# Patient Record
Sex: Female | Born: 2000 | Hispanic: No | Marital: Single | Smoking: Never smoker
Health system: Southern US, Community
[De-identification: ages and names within clinical notes are randomized; demographics above are authoritative.]

---
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
## 2019-09-10 ENCOUNTER — Ambulatory Visit: Payer: Self-pay | Attending: Internal Medicine

## 2019-09-10 DIAGNOSIS — Z23 Encounter for immunization: Secondary | ICD-10-CM

## 2019-09-10 NOTE — Progress Notes (Signed)
   Covid-19 Vaccination Clinic  Name:  Elizabeth Whitaker    MRN: [id number] DOB: 12-01-2000  09/10/2019  [name] was observed post Covid-19 immunization for 15 minutes without incident. She was provided with Vaccine Information Sheet and instruction to access the V-Safe system.   [name] was instructed to call 911 with any severe reactions post vaccine: Marland Kitchen Difficulty breathing  . Swelling of face and throat  . A fast heartbeat  . A bad rash all over body  . Dizziness and weakness   Immunizations Administered    Name Date Dose VIS Date Route   Pfizer COVID-19 Vaccine 09/10/2019 11:44 AM 0.3 mL 06/05/2018 Intramuscular   Manufacturer: [employer], [employer]   Lot: [id number]   NDC: [address]-9

## 2022-03-07 ENCOUNTER — Ambulatory Visit: Payer: BC Managed Care – PPO | Attending: Cardiovascular Disease | Admitting: Cardiovascular Disease

## 2022-03-07 ENCOUNTER — Encounter: Payer: Self-pay | Admitting: Cardiovascular Disease

## 2022-03-07 VITALS — BP 104/80 | HR 69 | Ht 64.0 in | Wt 118.4 lb

## 2022-03-07 DIAGNOSIS — R079 Chest pain, unspecified: Secondary | ICD-10-CM | POA: Diagnosis not present

## 2022-03-07 DIAGNOSIS — R0602 Shortness of breath: Secondary | ICD-10-CM

## 2022-03-07 NOTE — Progress Notes (Unsigned)
Cardiology Office Note:    Date:  03/08/2022   ID:  Elizabeth Whitaker, DOB 2000/06/04, MRN [id number]  PCP:  Patient, No Pcp Per   [facility] Providers Cardiologist:  None     Referring MD: No ref. provider found   Chief Complaint  Patient presents with   Chest Pain   Shortness of Breath    History of Present Illness:    Elizabeth Whitaker is a 21 y.o. female with a hx of previous excellent health and no chronic health conditions, has had approximately 1 month of chest discomfort, fatigue and dyspnea.  Symptoms began relatively abruptly on a Monday afternoon 1 month ago.  Although the symptoms have improved they still persist.  She has 2 types of chest discomfort.  The dominant discomfort is a pressure-like sensation in her anterior chest.  This is somewhat positional, but is not associated with physical activity.  She also has some sharp shooting unpredictable episodes of discomfort that occur in various locations throughout her chest.  She denies palpitations, dizziness, syncope, lower extremity edema, focal neurological complaints or claudication.  She has occasional headaches.  She was seen in urgent care and subsequently in the emergency room at [facility] on 02/16/2022.  Her chest x-ray was normal with a narrow mediastinum and no evidence of cardiomegaly or lung infiltrates.  D-dimer was low.  Her electrocardiogram was normal.  Routine labs are also normal,, including a hemoglobin of 12.7 and WBC 9.7.  She reports several episodes of joint subluxation but she has never had a joint dislocation.  Due to her joint hypermobility one of her providers has raised the possibility that she might have a variant of Ehlers-Danlos syndrome.  She received both COVID-19 and flu shots this fall, but they were administered after the onset of her symptoms.  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  History reviewed. No pertinent past medical history.  History reviewed. No pertinent surgical history.  Current Medications: Current Meds  Medication Sig   levonorgestrel-ethinyl estradiol (ALESSE) 0.1-20 MG-MCG tablet Take 1 tablet by mouth daily.     Allergies:   Patient has no allergy information on record.   Social History   Socioeconomic History   Marital status: Single    Spouse name: Not on file   Number of children: Not on file   Years of education: Not on file   Highest education level: Not on file  Occupational History   Not on file  Tobacco Use   Smoking status: Never    Passive exposure: Never   Smokeless tobacco: Never  Substance and Sexual Activity   Alcohol use: Not on file   Drug use: Not on file   Sexual activity: Not on file  Other Topics Concern   Not on file  Social History Narrative   Not on file   Social Determinants of Health   Financial Resource Strain: Not on file  Food Insecurity: Not on file  Transportation Needs: Not on file  Physical Activity: Not on file  Stress: Not on file  Social Connections: Not on file     Family History: The patient early onset CAD or other heart or vascular problems  ROS:   Please see the history of present illness.     All other systems reviewed and are negative.  EKGs/Labs/Other Studies Reviewed:    The following studies were reviewed today: Notes, imaging studies, labs, ECG report  from [facility]  EKG:  EKG is  ordered today.  The ekg ordered today demonstrates normal sinus rhythm, normal tracing  Recent Labs: No results found for requested labs within last 365 days.   Recent Lipid Panel No results found for: "CHOL", "TRIG", "HDL", "CHOLHDL", "VLDL", "LDLCALC", "LDLDIRECT"   Risk Assessment/Calculations:                Physical Exam:    VS:  BP 104/80 (BP Location: Left Arm, Patient Position: Sitting, Cuff Size: Normal)   Pulse 69   Ht 5\' 4"  (1.626 m)   Wt  53.7 kg   SpO2 99%   BMI 20.32 kg/m     Wt Readings from Last 3 Encounters:  03/07/22 53.7 kg     GEN: Very slender, peers fit, well nourished, well developed in no acute distress HEENT: Normal NECK: No JVD; No carotid bruits LYMPHATICS: No lymphadenopathy CARDIAC: RRR, prominent physiologically split second heart sound, no murmurs, rubs, gallops RESPIRATORY:  Clear to auscultation without rales, wheezing or rhonchi  ABDOMEN: Soft, non-tender, non-distended MUSCULOSKELETAL:  No edema; No deformity  SKIN: Warm and dry NEUROLOGIC:  Alert and oriented x 3 PSYCHIATRIC:  Normal affect   ASSESSMENT:    1. Chest pain, unspecified type   2. Shortness of breath    PLAN:    In order of problems listed above:  Chest pain: Atypical.  Not associated with physical activity.  No changes on ECG.  On OCP, but D-dimer was quite low when assessed at symptom onset.  Consider pericarditis.  Check echocardiogram. Joint hypermobility: Will evaluate the ascending aorta for any signs of dilation on the echocardiogram.           Medication Adjustments/Labs and Tests Ordered: Current medicines are reviewed at length with the patient today.  Concerns regarding medicines are outlined above.  Orders Placed This Encounter  Procedures   EKG 12-Lead   ECHOCARDIOGRAM COMPLETE   No orders of the defined types were placed in this encounter.   Patient Instructions  Medication Instructions:  Your physician recommends that you continue on your current medications as directed. Please refer to the Current Medication list given to you today.  *If you need a refill on your cardiac medications before your next appointment, please call your pharmacy*  Testing/Procedures: Your physician has requested that you have an echocardiogram. Echocardiography is a painless test that uses sound waves to create images of your heart. It provides your doctor with information about the size and shape of your heart and how  well your heart's chambers and valves are working. This procedure takes approximately one hour. There are no restrictions for this procedure. Please do NOT wear cologne, perfume, aftershave, or lotions (deodorant is allowed). Please arrive 15 minutes prior to your appointment time.  Follow-Up: At [facility], you and your health needs are our priority.  As part of our continuing mission to provide you with exceptional heart care, we have created designated Provider Care Teams.  These Care Teams include your primary Cardiologist (physician) and Advanced Practice Providers (APPs -  Physician Assistants and Nurse Practitioners) who all work together to provide you with the care you need, when you need it.  We recommend signing up for the patient portal called "MyChart".  Sign up information is provided on this After Visit Summary.  MyChart is used to connect with patients for Virtual Visits (Telemedicine).  Patients are able to view lab/test results, encounter notes, upcoming appointments, etc.  Non-urgent messages can be sent to your provider as well.   To learn more about what you can do with MyChart, go to [email or web address].    Your next appointment:   AS NEEDED with Dr. Royann Shivers          Signed, Thurmon Fair, MD  03/08/2022 8:50 AM    [facility]

## 2022-03-07 NOTE — Patient Instructions (Signed)
Medication Instructions:  Your physician recommends that you continue on your current medications as directed. Please refer to the Current Medication list given to you today.  *If you need a refill on your cardiac medications before your next appointment, please call your pharmacy*  Testing/Procedures: Your physician has requested that you have an echocardiogram. Echocardiography is a painless test that uses sound waves to create images of your heart. It provides your doctor with information about the size and shape of your heart and how well your heart's chambers and valves are working. This procedure takes approximately one hour. There are no restrictions for this procedure. Please do NOT wear cologne, perfume, aftershave, or lotions (deodorant is allowed). Please arrive 15 minutes prior to your appointment time.  Follow-Up: At [facility], you and your health needs are our priority.  As part of our continuing mission to provide you with exceptional heart care, we have created designated Provider Care Teams.  These Care Teams include your primary Cardiologist (physician) and Advanced Practice Providers (APPs -  Physician Assistants and Nurse Practitioners) who all work together to provide you with the care you need, when you need it.  We recommend signing up for the patient portal called "MyChart".  Sign up information is provided on this After Visit Summary.  MyChart is used to connect with patients for Virtual Visits (Telemedicine).  Patients are able to view lab/test results, encounter notes, upcoming appointments, etc.  Non-urgent messages can be sent to your provider as well.   To learn more about what you can do with MyChart, go to [email or web address].    Your next appointment:   AS NEEDED with Dr. Royann Shivers

## 2022-03-08 ENCOUNTER — Encounter: Payer: Self-pay | Admitting: Cardiovascular Disease

## 2022-03-08 ENCOUNTER — Ambulatory Visit (HOSPITAL_COMMUNITY): Payer: BC Managed Care – PPO | Attending: Cardiovascular Disease

## 2022-03-08 DIAGNOSIS — R0602 Shortness of breath: Secondary | ICD-10-CM | POA: Insufficient documentation

## 2022-03-08 DIAGNOSIS — R079 Chest pain, unspecified: Secondary | ICD-10-CM | POA: Diagnosis present

## 2022-03-08 LAB — ECHOCARDIOGRAM COMPLETE
Area-P 1/2: 4.02 cm2
S' Lateral: 2.9 cm

## 2022-03-29 ENCOUNTER — Other Ambulatory Visit (HOSPITAL_COMMUNITY): Payer: BC Managed Care – PPO
